# Patient Record
Sex: Male | Born: 1940 | Race: White | Hispanic: No | Marital: Married
Health system: Midwestern US, Community
[De-identification: ages and names within clinical notes are randomized; demographics above are authoritative.]

## PROBLEM LIST (undated history)

## (undated) DIAGNOSIS — M65352 Trigger finger, left little finger: Secondary | ICD-10-CM

---

## 2014-10-23 ENCOUNTER — Inpatient Hospital Stay: Admit: 2014-10-23 | Payer: MEDICARE | Attending: Cardiovascular Disease

## 2014-10-23 DIAGNOSIS — I208 Other forms of angina pectoris: Secondary | ICD-10-CM

## 2014-10-23 MED ORDER — NORMAL SALINE FLUSH 0.9 % IV SOLN
0.9 % | Freq: Two times a day (BID) | INTRAVENOUS | Status: DC
Start: 2014-10-23 — End: 2014-10-24

## 2014-10-23 MED ORDER — TECHNETIUM TC 99M SESTAMIBI - CARDIOLITE
Freq: Once | Status: AC | PRN
Start: 2014-10-23 — End: 2014-10-23
  Administered 2014-10-23: 13:00:00 9.3 via INTRAVENOUS

## 2014-10-23 MED ORDER — NORMAL SALINE FLUSH 0.9 % IV SOLN
0.9 % | INTRAVENOUS | Status: DC | PRN
Start: 2014-10-23 — End: 2014-10-24
  Administered 2014-10-23 (×2): 10 mL via INTRAVENOUS

## 2014-10-23 MED ORDER — TECHNETIUM TC 99M SESTAMIBI - CARDIOLITE
Freq: Once | Status: AC | PRN
Start: 2014-10-23 — End: 2014-10-23
  Administered 2014-10-23: 14:00:00 31 via INTRAVENOUS

## 2016-02-14 ENCOUNTER — Ambulatory Visit: Admit: 2016-02-14 | Discharge: 2016-02-14 | Payer: MEDICARE | Attending: Orthopaedic Surgery

## 2016-02-14 DIAGNOSIS — M65312 Trigger thumb, left thumb: Secondary | ICD-10-CM

## 2016-02-14 NOTE — Progress Notes (Signed)
Chief Complaint:   Chief Complaint   Patient presents with   ??? Finger Pain     Dr. Tennis MustAlbani referral left thumb pain and clicking       HPI 75 year old man with several months of pain and locking in the left thumb. He also has some increase in some chronic locking symptoms in the left middle finger.  He does have a history of prior right middle trigger finger that ultimately require surgical release and did well. He had injection to the left middle finger in the past with temporary improvement.    There is no problem list on file for this patient.      History reviewed. No pertinent past medical history.    Past Surgical History:   Procedure Laterality Date   ??? BACK SURGERY  1965   ??? FINGER TRIGGER RELEASE Right 03/04/2010    Right middle trigger finger  R.Loman ChromanBoniface, MD   ??? KNEE ARTHROSCOPY Left 02/18/2012    Left knee arthroscopy R. Loman ChromanBoniface, MD   ??? TONSILLECTOMY         No current outpatient prescriptions on file.     No current facility-administered medications for this visit.        No Known Allergies    Social History     Social History   ??? Marital status: Married     Spouse name: N/A   ??? Number of children: N/A   ??? Years of education: N/A     Social History Main Topics   ??? Smoking status: Former Smoker     Quit date: 1994   ??? Smokeless tobacco: Never Used   ??? Alcohol use 0.6 oz/week     1 Cans of beer per week   ??? Drug use: No   ??? Sexual activity: Not Asked     Other Topics Concern   ??? None     Social History Narrative   ??? None       Family History   Problem Relation Age of Onset   ??? Heart Disease Brother 8268     pacemaker         Review of Systems   No fever, chills, or other constitutional symptoms.  No numbness or other neuro symptoms.    Ortho Exam left hand exam demonstrates palpable locking and triggering of the IP joint of the right thumb with palpable triggering at the A1 pulley level.  Similar but less pronounced findings are present in the left middle ray.    Physical Exam    Patient is alert and  oriented.  Well-developed well-nourished. BMI 29.7  Pupils equal and reactive. Sclerae anicteric.  Neck supple  Lungs clear.  Cardiac rate and rhythm regular.  Abdomen soft and nontender.  Skin warm and dry.                    ASSESSMENT/PLAN:    Antonio Aguilar was seen today for finger pain.    Diagnoses and all orders for this visit:    Trigger thumb of left hand    Trigger finger, left middle finger     Treatment options reviewed.  Based on his prior experience he does not wish to have a steroid injections. He requests surgical release of the left thumb and middle finger.  The procedure, indications, risks, limitations and recovery time were all reviewed with patient, who requests to proceed.  We will tentatively schedule as outpatient for March 05, 2016.    Return in about  4 weeks (around 03/12/2016) for wound check Postoperative trigger fingers.       Concha Norway, MD    02/14/2016  11:53 AM

## 2016-02-14 NOTE — Progress Notes (Signed)
Surgical Procedure: LEFT THUMB / MIDDLE FINGER TENOSYNOVECTOMY, local, Date: Wednesday, March 05, 2016, morning, Place: [facility], Doctor:Raymond J Boniface. Pre Op Instructions given to patient. Patient takes no medications.

## 2016-02-15 NOTE — Telephone Encounter (Signed)
[facility] [employer] [facility]  No pre-cert required for Left thumb/middle finger tenosynovectomy ([id number]) 03/05/2016.  Call Ref# Mery I  02/15/2016

## 2016-03-13 ENCOUNTER — Ambulatory Visit: Admit: 2016-03-13 | Discharge: 2016-03-13 | Payer: MEDICARE | Attending: Orthopaedic Surgery

## 2016-03-13 DIAGNOSIS — M65312 Trigger thumb, left thumb: Secondary | ICD-10-CM

## 2016-03-13 NOTE — Progress Notes (Signed)
Sutures removed from op sites thumb and middle finger left hand.

## 2016-03-13 NOTE — Progress Notes (Signed)
Vincente Libertyennis M Radin  presents for follow-up status post left thumb and middle finger trigger finger releases, doing well reports complete relief of his preoperative pain and locking although he does still have the expected degree of local tenderness and pain with grip activities. No numbness tingling in the fingers no other joint complaints.    Allergies; medications; past medical, surgical, family, and social history; and problem list have been reviewed today and updated as indicated in this encounter.    Exam: Incisional sites of the left thumb and middle finger are benign, thumb is well approximated without erythema mild tenderness no locking, middle finger does show slight superficial opening but dry no erythema nor drainage.    Radiographs: Not applicable    Assessment and Plan: Doing well, sutures were removed today, patient was advised on some simple home exercise with gradual resumption of activities tolerance, follow-up in a month or so if having any residual problems or otherwise as needed.

## 2016-10-02 ENCOUNTER — Ambulatory Visit: Admit: 2016-10-02 | Discharge: 2016-10-02 | Payer: MEDICARE | Attending: Orthopaedic Surgery

## 2016-10-02 ENCOUNTER — Ambulatory Visit: Admit: 2016-10-02 | Discharge: 2016-10-06 | Payer: MEDICARE

## 2016-10-02 DIAGNOSIS — R200 Anesthesia of skin: Secondary | ICD-10-CM

## 2016-10-02 DIAGNOSIS — M25511 Pain in right shoulder: Secondary | ICD-10-CM

## 2016-10-02 NOTE — Progress Notes (Signed)
Chief Complaint:   Chief Complaint   Patient presents with   . Hand Problem     Bilateral hand numbness and tingling x 2 months  Dr. Tennis Must had him try braces with no relief   . Shoulder Pain     Rt shoulder pain x 4 weeks.  Difficulty raising right arm.  May have injured after lifting heavy box.   No X-rays       HPI 76 year old man complaining of bilateral hand numbness 2 months. Numbness affects all fingers. Notices it at night whenever he sleeps on the affected side and it takes some time in the morning to resolve. He does not have much trouble using the hands with activity.  He was given braces by primary care but has not found them helpful.    Also has had about 3 weeks of right shoulder pain after doing some lifting. The patient denies history of known cervical disease.    Patient Active Problem List   Diagnosis   . Acute pain of right shoulder       History reviewed. No pertinent past medical history.    Past Surgical History:   Procedure Laterality Date   . BACK SURGERY  1965   . FINGER TRIGGER RELEASE Right 03/04/2010    Right middle trigger finger  R.Alazne Quant, MD   . FINGER TRIGGER RELEASE Left 03/05/2016    Left trigger thumb release R. Jawuan Robb, MD   . KNEE ARTHROSCOPY Left 02/18/2012    Left knee arthroscopy R. Loman Chroman, MD   . TONSILLECTOMY         No current outpatient prescriptions on file.     No current facility-administered medications for this visit.        No Known Allergies    Social History     Social History   . Marital status: Married     Spouse name: N/A   . Number of children: N/A   . Years of education: N/A     Social History Main Topics   . Smoking status: Former Smoker     Quit date: 1994   . Smokeless tobacco: Never Used   . Alcohol use 0.6 oz/week     1 Cans of beer per week   . Drug use: No   . Sexual activity: Not Asked     Other Topics Concern   . None     Social History Narrative   . None       Family History   Problem Relation Age of Onset   . Heart Disease Brother 70      pacemaker         Review of Systems   No fever, chills, or other constitutional symptoms.  No numbness or other neuro symptoms.    Ortho Exam right shoulder exam demonstrates some discomfort with flexion abduction and internal rotation. He has forward flexion 120 abduction 110 internal rotation to the sacroiliac level. Abduction strength 4+/5. No effusion or crepitation. New. Bilateral hand exam demonstrates perhaps slight mild thenar atrophy. The more symptomatic left hand does have some tingling with Phalen testing, Tinel is negative.    Physical Exam    Patient is alert and oriented.  Well-developed well-nourished.BMI 29  Pupils equal and reactive. Sclerae anicteric.  Neck supple  Lungs clear.  Cardiac rate and rhythm regular.  Abdomen soft and nontender.  Skin warm and dry.       XRAY: AP and lateral x-ray view right shoulder taken today.  There is some degenerative change the glenohumeral joint with inferior osteophyte but reasonable joint space preservation. Slight cystic change and greater tuberosity. Subacromial space is intact. 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                    ASSESSMENT/PLAN:    Antonio Aguilar was seen today for hand problem and shoulder pain.    Diagnoses and all orders for this visit:    Numbness and tingling in both hands  -     EMG; Future    Acute pain of right shoulder  -     XR SHOULDER RIGHT (MIN 2 VIEWS); Future  -     Ambulatory referral to Physical Therapy     we'll treat right shoulder as strain with underlying osteoarthritis. He will see physical therapy to instruct in appropriate exercises.    Patient is referred for EMG testing bilateral upper extremities to evaluate for possible carpal tunnel syndrome, and to screen for cervical radiculopathy.    Return in about 4 weeks (around 10/30/2016).     If right shoulder not improved consider steroid injection.    Consider carpal tunnel release if  EMGs confirmed carpal tunnel syndrome.      Concha Norwayaymond J Imya Mance, MD    10/02/2016  9:16 AM

## 2016-11-10 ENCOUNTER — Ambulatory Visit: Admit: 2016-11-10 | Discharge: 2016-11-10 | Payer: MEDICARE | Attending: Physical Medicine & Rehabilitation

## 2016-11-10 DIAGNOSIS — G5603 Carpal tunnel syndrome, bilateral upper limbs: Secondary | ICD-10-CM

## 2016-11-10 NOTE — Patient Instructions (Signed)
Electrodiagnotic Laboratory  Accredited by the [employer] with Exemplary status  Janeen Masternick-Black, D.O  Stephanie Kopey, D.O.   [facility]  [address] [place] [address]  Phone: [phone]  Fax: [phone]        Today you had an electrodiagnostic exam which included nerve conduction studies (NCS) and electromyography (EMG). This test evaluated the electrical activity of your nerves and muscles to help determine if you have a nerve or muscle disease.  This test can help determine the location and type of a nerve or muscle problem. This will help your referring doctor diagnose your condition and determine the appropriate next step in your treatment plan.     After your test:    1. There are no long lasting side effects of the test.     2. You may resume your normal activities without restrictions.     3.  Resume any medications that were stopped for the test.     4  If you have sore areas or bruising in your muscles where the needle was placed, apply a cold pack to the sore area for 15-20 minutes three to four times a day as needed for pain.  The soreness should go away in about 1-2 days.     5. Your results were provided  Briefly at the end of your test and the final detailed report will be provided to your referring physician, and/or primary care physician and any other parties you requested within 1-2 days of the examination. You may wish to contact your referring provider after a few days to determine what they would like you to do next.     6.  Please call [phone] with any questions or concerns and if you develop increased body temperature/fever, swelling, tenderness, increased pain and/or drainage from the sites where the needle was placed.     Thank you for choosing us for your health care needs.

## 2016-11-10 NOTE — Progress Notes (Signed)
[facility]  Electrodiagnostic Laboratory  *Accredited by the [employer] with exemplary status  [address]  [place], [place] [id number]  Phone: [phone]  Fax: [phone]      Date of Examination: 11/10/16  Patient Name: Antonio Aguilar    Chief Complaint   Patient presents with   . Hand Pain     L>R   . Shoulder Pain   . Hand Numbness     L>R       Antonio Aguilar  is a 76 y.o. year old male who was seen due to complaints of numbness/tingling in bilateral hands that has been present for 3-4 months, worse at night. Also with pain in bilateral shoulders that has been present for 3-4 weeks and started after lifting boxes.     Physical Exam: MSK: There is no joint effusion, deformity, instability, swelling, erythema or warmth.  AROM is full in the spine. Decreased ROM bilateral shoulders with pain. TTP bilateral AC joints, subacromial space. Neurologic:  No focal sensorimotor deficit.  Reflexes 2+ and symmetric. Gait is normal.    Impression:     1. Bilateral carpal tunnel syndrome    2. Numbness and tingling in both hands        Plan:    EMG is indicated to evaluate the above diagnosis.    Orders Placed This Encounter   Procedures   . PR NEEDLE EMG EA EXTREMTY W/PARASPINL AREA COMPLETE   . PR MOTOR &/SENS 7-8 NRV CNDJ PRECONF ELTRODE LIMB      EMG was done today and showed bilateral carpal tunnel syndrome, moderate. The patient was educated about the diagnosis and the prognosis.    Recommend cock up wrist splints qhs, consider OT, injection vs surgical release.    Advised patient to follow up with referring provider.       Thank you for allowing me to participate in the care of your patient.      Sincerely,     Elwin MochaJaneen Masternick-Black, DO, [facility]   Board Certified Physical Medicine and Rehabilitation

## 2016-11-10 NOTE — Progress Notes (Signed)
[facility]  Electrodiagnostic Laboratory  *Accredited by the [employer] with exemplary status  [address]  [place], [place] [address]  Phone: [phone]  Fax: [phone]      Referring Provider: Concha Norway, MD  Primary Care Physician: Mickel Crow  Patient Name: Antonio Aguilar  Patient birth date: 1941/04/19  Gender: male  BMI: Body mass index is 28.48 kg/m??.  Height 6' (1.829 m), weight 210 lb (95.3 kg).    11/10/2016    Diagnostic Interpretation:     This study was abnormal.     1. 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 It is chronic in duration. This is consistent with the clinical diagnosis of carpal tunnel syndrome. Prognosis is good if cause is eliminated.     2. There is no electrodiagnostic evidence for C5-T1 motor radiculopathy in bilateral upper extremities. Sensory radiculopathy cannot be studied by EMG.           Description of clinical problem:   Chief Complaint   Patient presents with   ??? Hand Pain     L>R   ??? Shoulder Pain   ??? Hand Numbness     L>R     Pain Yes  Pain Score:   4; Numbness/tingling  Yes; Weakness  No       Special considerations:   Pacemaker/Defibrillator No; Anticoagulation or Antiplatelet No; Mestinon No; botulinum Toxin  No       Pertinent history:  Diabetes  No; Thyroid disease No; Alcohol abuse No; Family history of neuromuscular disease No; Pertinent surgical history Yes; left hand trigger finger    Allergies: Adhesive: No; Isopropyl alcohol: No       Brief physical exam:   Sensory deficit No; Weakness No; Atrophy  No; Reflex abnormality No    Consent: The patient was advised of the indications, risks, benefits and alternatives to nerve conduction studies and electromyography and agreed to proceed.     Sensory NCS      Nerve / Sites Rec. Site  Peak Lat PP Amp Segments Distance Velocity Temp.     ms ??V  cm m/s ??C   L Median - Digit II (Antidromic)      Palm Dig II 2.03 15.7 Palm - Dig II 7 46 32.3      Wrist Dig II 4.43 10.1 Wrist - Dig II 14 38 32.3   R Median - Digit II (Antidromic)      Palm Dig II 1.98 18.9 Palm - Dig II 7 48 33.2      Wrist Dig II 4.06 14.4 Wrist - Dig II 14 44 33.2   L Ulnar - Digit V (Antidromic)      Wrist Dig V 3.13 24.1 Wrist - Dig V 14 58 32.3   L Radial - Anatomical snuff box (Forearm)      Forearm Wrist 2.29 26.0 Forearm - Wrist 10 58 33.1       Motor NCS      Nerve / Sites Muscle Onset Amplitude Segments Distance Velocity Temp.     ms mV  cm m/s ??C   L Median - APB      Palm APB 2.24 9.8 Palm - APB   33.3      Wrist APB 5.78 6.1 Wrist - Palm 8 23 33.3  Elbow APB 9.95 5.4 Elbow - Wrist 21.5 52 33.3   R Median - APB      Palm APB 2.19 9.7 Palm - APB   33.3      Wrist APB 4.22 6.3 Wrist - Palm 8 39 33.2      Elbow APB 8.75 5.8 Elbow - Wrist 23.5 52 33.2   L Ulnar - ADM      Wrist ADM 2.66 10.2 Wrist - ADM 8  33.2      B.Elbow ADM 6.88 9.6 B.Elbow - Wrist 23.5 56 33.2      A.Elbow ADM 8.75 8.7 A.Elbow - B.Elbow 10 53 33.2       F Wave      Nerve Fmin % F    ms %   L Median - APB 30.05 80   L Ulnar - ADM 28.54 70   R Median - APB 31.55 60       EMG      EMG Summary Table     Spontaneous MUAP Recruitment   Muscle Nerve Roots IA Fib PSW Fasc Amp Dur. PPP Pattern   L. Deltoid Axillary C5-C6 N None None None N N N N   L. Biceps brachii Musculocutaneous C5-C6 N None None None N N N N   L. Triceps brachii Radial C6-C8 N None None None N N N N   L. Pronator teres Median C6-C7 N None None None N N N N   L. First dorsal interosseous Ulnar C8-T1 N None None None N N N N   L. Abductor pollicis brevis Median C8-T1 N None None None N N N N   R. Deltoid Axillary C5-C6 N None None None N N N N   R. Biceps brachii Musculocutaneous C5-C6 N None None None N N N N   R. Triceps brachii Radial C6-C8 N None None None N N N N   R. Pronator teres  Median C6-C7 N None None None N N N N   R. First dorsal interosseous Ulnar C8-T1 N None None None N N N N   R. Abductor pollicis brevis Median C8-T1 N None None None N N N N          Pain was assessed/reassessed during EMG and managed with appropriate intervention throughout the entire procedure. The patient was instructed in post procedure care.     Technical Considerations:  Obesity  No, Poor tolerance to test No, Skin callous No, Skin breakdown No, Edema No    Summary of Findings:   1. Sensory nerve conduction studies of bilateral median nerves showed prolonged peak wrist latencies, normal amplitudes and slow conduction velocity across the wrist. The left radial and ulnar nerves showed normal peak latencies, normal SNAP amplitudes, and normal conduction velocities.     2. Motor nerve conduction studies of the left median nerve showed prolonged distal wrist latencies, normal amplitudes and slow conduction velocity across the wrist. The right median nerve showed normal distal latencies, normal amplitudes and slow conduction velocity across the wrist. The left ulnar nerve showed normal distal latencies, normal CMAP amplitudes, and normal conduction velocities.     3. F-wave studies of bilateral median and left ulnar nerves revealed normal latencies.     4.  EMG was performed with concentric needle in multiple muscles outlined above. All muscles tested revealed normal insertional activity, without evidence of abnormal spontaneous activity. All MUAP's were of normal amplitude and duration with a full recruitment pattern. No increase  in polyphasic potentials was noted.        Diagnostic Interpretation:     This study was abnormal.     1. 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 It is chronic in duration. This is consistent with the  clinical diagnosis of carpal tunnel syndrome. Prognosis is good if cause is eliminated.     2. There is no electrodiagnostic evidence for C5-T1 motor radiculopathy in bilateral upper extremities. Sensory radiculopathy cannot be studied by EMG.         Comparison to prior EMG: There is not a prior EMG for comparison.      Follow up EMG is recommended in 6-12 months if symptoms persist despite treatment.     Thank you for the consultation and for allowing me to participate in the care of your patient.      Technologist: Charisse Klinefelter, LPN, CNCT  Physician:Purity Irmen Masternick-Black, DO, FAAPMR   Board Certified Physical Medicine and Rehabilitation      Normal nerve Conduction Values    For sensory nerve conduction studies, the amplitude is measured peak-to-peak, the latency reported is the distal peak latency, and the conduction velocity, if measured, is determined from the onset latencies and is over the forearm or leg respectively.      For motor nerve conduction studies, the amplitude is measured baseline-to-peak, the latency reported is the distal onset latency, the conduction velocity is calculated over the forearm or leg respectively. F waves are reported as minimal latency and are performed as follows: recording the abductor pollicis brevis stimulating the median nerve at the wrist, recording the abductor digiti minimi stimulating the ulnar nerve at the wrist, recording at the extensor digitorum brevis stimulating the peroneal nerve at the ankle and recording the abductor hallucis stimulating tibial at the medial malleolus.     For H-Reflexes the site of stimulation is in the popliteal fossa and the recording site is the gastrocnemius.  For F waves, the site of stimulation is at the wrist or ankle respectively and the muscle recorded is the same as that used for the compound motor unit action potentials as described in the table.          Sensory Nerves Peak to Peak  Amplitude  (mV) Peak Latency (ms)    Superficial Radial Sensory Antidromic (10cm) 11 2.8    Median Sensory Antidromic Dig II   Palm (7cm)  Wrist (14 cm)  Age 44-49 BMI <24  Age 17-79 BMI <24  Age 41-49 BMI >/= 24  Age 74-79 BMI >/= 24   8  13  19  15  13  8    2.3  4     Ulnar Sensory Antidromic Dig V (14 cm)  Age 58-49 BMI <24  Age 45-79 BMI <24  Age 80-49 BMI >/= 24  Age 65-79 BMI >/= 24 9  13  13  8  4 4    Medial Antebrachial cutaneous Sensory Antidromic (10 cm)   3 2.6   Lateral Antebrachial cutaneous Sensory Antidromic (10 cm) 6 2.5   Sural Sensory Antidromic (14 cm) 4 4.5     Medial and lateral Plantars (14 cm)   Compare side to side <3.8     Superficial peroneal sensory (10 cm)  Age <9  Age 44-29  Age 49-49  Age 84-59  Age >35   >6  >6  >  5  >4  >3   <4.3  <4.4  <4.5  <4.6  <4.6     Saphenous sensory (12 cm)  Age <9  Age 29-29  Age 33-49  Age 37-59  Age >14   >6  >6  >4  >4  >3   <4.3  <4.4  <4.5  <4.6  <4.6     Dorsal ulnar sensory (8 cm)  Age <9  Age 63-29  Age 36-49  Age 76-59  Age >20   >93  >18  >80  >10  >5   <2.9  <3  <3.1  <3.1  <3.2         Study Latency difference (ms)   Median compared to (minus) ulnar motor comparison  All ages  Age 41-49  Age 67-79   1.5  1.4  1.7   Median to Ulnar comparison (second lumbrical/interossei)  0.4     Combined Sensory Index:   Study Latency Difference (ms)</=   Median to Ulnar Palmar Orthodromic mixed nerve comparison 0.3   Median to Ulnar sensory Ring finger comparison 0.4       Median: Radial sensory digit 1 comparison 0.5     Combined Sensory Index (CSI)  0.9       Motor Nerves Baseline to Peak Amplitude  (mV) Conduction Velocity (m/s) Distal Latency (ms)   Median motor APB  All ages  Men Age13 to 31  Men Age 49 to 30  Men Age 31 to 62  Men Age 22 to 75  Men age 36 to 62  Women Age 35 to 5  Women Age 8 to 52  Women Age 66 to 30  Women Age 89 to 84  Women Age 21 to 67   4.1  5.9  4.2   4.2   3.8  3.8  5.9  4.2  4.2  3.8  3.8   49  49  47  47  47  47  53  51  51  51  51    4.5  4.6  4.6  4.7  4.7  4.7  4.4  4.4  4.4  4.4  4.4   Ulnar motor ADM  All ages  Below elbow  Across elbow  Above elbow  CV drop across elbow  % CV drop across elbow   7.9     52  43  50  15 m/s  23%   3.7   Fibular (peroneal) motor EDB  All ages  Age 91 to 69 <170 cm tall   Age 8 to 52 >170 cm tall  Age 96 to 10 <170 cm tall  Age 71 to 18 >170 cm tall  CV across fibular head  CV drop across fibular head  % CV drop across fibular head  % amplitude drop ankle to below fibula  % amplitude drop across fibular head   1.3  2.6  2.6  1.1  1.1        32%  25%   38  43  37  39  36  42  32m/s  12%     6.5  6.5  6.5  6.5  6.5   Tibial motor AH  All ages  Age 43 to 30 <160 cm tall  Age 60-49 <160 cm tall  Age 53 to 40 <160 cm tall  Age 24 to 41 <160 cm tall  Age 25 to 7, 160-170 cm tall  Age 60-49 160-170 cm tall  Age 49 to  59 160-170 cm tall  Age 75 to 79 160-170 cm tall  Age 74 to 6929 >/=170 cm tall  Age 78-49 >/=170 cm tall  Age 74 to 4259 >/=170 cm tall  Age 74 to 6879 >/=170 cm tall  Amplitude drop from ankle to knee  % amplitude drop ankle to knee   4.4  5.8  5.3  5.3  1.1  5.8  5.3  5.3  1.1  5.8  5.3  5.3  1.1  10.3  71%   39  44  44  40  40  42  42  34  34  37  37  34  34   6.1  6.1  6.1  6.1  6.1  6.1  6.1  6.1  6.1  6.1  6.1  6.1  6.1     Ulnar motor (FDI)  Age <9  Age 74-29  Age 74-49  Age 74-59  Age 7>60   >8  >8  >7  >7  >7   >51  >51  >50  >50  >50   <3.8  <3.8  <4.3  <4.5  <4.5     Radial motor (EDC)  Age <9  Age 74-29  Age 74-49  Age 74-59  Age 25>60   >6  >6  >6  >5  >5   78>51  36>51  >50  >50  >50   <3.0  <3.0  <3.1  <3.1  <3.1   Musculocutaneous motor (Biceps)   Age <9  Age 74-29  Age 74-49  Age 74-59  Age 26>60   >4  >4  >4  >4  >3    <3.5  <3.5  <3.5  <3.5  <3.8   Axillary motor (Deltoid)  Age <9  Age 74-29  Age 74-49  Age 74-59  Age 74>60   >4  >4  >4  >4  >3    <4.8  <4.8  <4.8  <4.8  <5     Tibial motor (ADQP)  Age <9  Age 74-29  Age 74-49  Age 950-59  Age 39>60   >4  >4  >4  >3  >3   >41  >41  >41  >40  >40    <6.0  <6.0  <6.5  <6.5  <6.5   Peroneal motor (TA)  Age <9  Age 74-29>4  Age 74-49  Age 74-59  Age 49>60   >4  >4  >4  >3  >3   >41  >41  >40  >40  >40   <4  <4  <4  <4.5  <4.5     Femoral motor (RF)  Age <9  Age 74-29  Age 830-49  Age 150-59  Age 47>60   >4  >4  >4  >3  >3      <6.0  <6.0  <6.5  <6.5  <6.5         Nerve F  Minimal latency (ms)   Median (APB) 32   Ulnar (ADM)  32   Peroneal motor (EDB) <56   Tibial motor (AH) <56     Nerve  H-reflex latency (ms) H reflex- side to side latency  difference (ms)   Tibial  (gatroc-soleus) <34  <1.5       Needle EMG:  Fibrillations, positive sharp waves and fasciculations are graded from none (0) to continuous (4+).  The configuration and recruitment pattern of motor unit action potentials under voluntary control are described below.  Cc: Concha Norway, MD  Mickel Crow

## 2016-11-17 ENCOUNTER — Encounter: Payer: MEDICARE | Attending: Orthopaedic Surgery

## 2016-11-28 ENCOUNTER — Encounter: Attending: Physical Medicine & Rehabilitation

## 2018-10-11 ENCOUNTER — Ambulatory Visit: Admit: 2018-10-11 | Discharge: 2018-10-11 | Payer: MEDICARE | Attending: Orthopaedic Surgery

## 2018-10-11 DIAGNOSIS — M65352 Trigger finger, left little finger: Secondary | ICD-10-CM

## 2018-10-11 NOTE — Progress Notes (Signed)
Surgical Procedure: LEFT SMALL FINGER TRIGGER FINGER RELEASE / TENOSYNOVECTOMY, Anesthesia: Local, Date: Monday, October 18, 2018, Time: case #2, Place: [facility], Surgeon: Concha Norway. Medications reviewed with the patient / holding no  medications. Pre Op Instructions reviewed with the patient. Informed patient: A hospital nurse will contact him with instructions for the day of surgery. The patient expresses understanding and is in agreement with the plan.      Post Op Appointment: Monday, June 22 cd at 10:15 am

## 2018-10-11 NOTE — Progress Notes (Signed)
Chief Complaint:   Chief Complaint   Patient presents with   ??? Trigger finger     Trigger finger left little finger.         HPI   78 year old man with several weeks of pain and locking of his left small finger.  Had previous other trigger fingers that came to surgery with surgical release and good outcome.  No injury history.      Patient Active Problem List   Diagnosis   ??? Acute pain of right shoulder   ??? Trigger little finger of left hand       No past medical history on file.    Past Surgical History:   Procedure Laterality Date   ??? BACK SURGERY  1965   ??? FINGER TRIGGER RELEASE Right 03/04/2010    Right middle trigger finger  R.Liliani Bobo, MD   ??? FINGER TRIGGER RELEASE Left 03/05/2016    Left trigger thumb release R. Loman ChromanBoniface, MD   ??? KNEE ARTHROSCOPY Left 02/18/2012    Left knee arthroscopy R. Loman ChromanBoniface, MD   ??? TONSILLECTOMY         Current Outpatient Medications   Medication Sig Dispense Refill   ??? gabapentin (NEURONTIN) 300 MG capsule TK 1 C PO TID FOR UP TO 20 DAYS PRN  11   ??? Glucosamine Sulfate 500 MG TABS Take 500 mg by mouth       No current facility-administered medications for this visit.        No Known Allergies    Social History     Socioeconomic History   ??? Marital status: Married     Spouse name: Not on file   ??? Number of children: Not on file   ??? Years of education: Not on file   ??? Highest education level: Not on file   Occupational History   ??? Not on file   Social Needs   ??? Financial resource strain: Not on file   ??? Food insecurity     Worry: Not on file     Inability: Not on file   ??? Transportation needs     Medical: Not on file     Non-medical: Not on file   Tobacco Use   ??? Smoking status: Former Smoker     Last attempt to quit: 1994     Years since quitting: 26.4   ??? Smokeless tobacco: Never Used   Substance and Sexual Activity   ??? Alcohol use: Yes     Alcohol/week: 1.0 standard drinks     Types: 1 Cans of beer per week   ??? Drug use: No   ??? Sexual activity: Not on file   Lifestyle   ??? Physical  activity     Days per week: Not on file     Minutes per session: Not on file   ??? Stress: Not on file   Relationships   ??? Social Wellsite geologistconnections     Talks on phone: Not on file     Gets together: Not on file     Attends religious service: Not on file     Active member of club or organization: Not on file     Attends meetings of clubs or organizations: Not on file     Relationship status: Not on file   ??? Intimate partner violence     Fear of current or ex partner: Not on file     Emotionally abused: Not on file     Physically abused: Not  on file     Forced sexual activity: Not on file   Other Topics Concern   ??? Not on file   Social History Narrative   ??? Not on file       Family History   Problem Relation Age of Onset   ??? Heart Disease Brother 10        pacemaker         Review of Systems   No fever, chills, or other constitutionalsymptoms.  No numbness or other neuro symptoms.    @PHYSEXAMORTHO @   Left hand exam demonstrates no swelling or deformity other than some diffuse degenerative changes.  Full range of motion left small finger lacking 5 degrees of extension.  Palpable tenderness and locking at the A1 pulley level with locking of the PIP joint.    Physical Exam    Patient is alert and oriented.  Well-developed well-nourished.  BMI 30  Pupils equal and reactive. Scleraeanicteric.  Neck supple  Lungs clear.  Cardiac rate and rhythm regular.  Abdomen soft and nontender.  Skin warm and dry.                      ASSESSMENT/PLAN:    Lenard was seen today for trigger finger.    Diagnoses and all orders for this visit:    Trigger little finger of left hand    Treatment options reviewed.  He declines injection.  He wants to schedule surgical release as soon as possible under local anesthetic.  The procedure, indications, risks, limitations and recovery time were all reviewed with patient, who requests to proceed.    The patient was counseled at length about the risks of contracting Covid-19 during their perioperative period  and any recovery window from their procedure.  The patient was made aware that contracting Covid-19  may worsen their prognosis for recovering from their procedure  and lend to a higher morbidity and/or mortality risk.  All material risks, benefits, and reasonable alternatives including postponing the procedure were discussed. The patient does wish to proceed with the procedure at this time.          Return for TBA postop left small trigger finger release.       Margarette Asal, MD    10/11/2018  1:22 PM

## 2018-10-13 ENCOUNTER — Inpatient Hospital Stay: Payer: MEDICARE

## 2018-10-13 DIAGNOSIS — Z01818 Encounter for other preprocedural examination: Secondary | ICD-10-CM

## 2018-10-13 NOTE — H&P (Signed)
Margarette Asal, MD   Physician   Orthopedic Surgery                                       Chief Complaint   Patient presents with   ??? Trigger finger   ?? ?? Trigger finger left little finger.     ??  ??  HPI   78 year old man with several weeks of pain and locking of his left small finger.  Had previous other trigger fingers that came to surgery with surgical release and good outcome.  No injury history.    ??      Patient Active Problem List   Diagnosis   ??? Acute pain of right shoulder   ??? Trigger little finger of left hand   ??  ??  Past Medical History   No past medical history on file.     ??  Past Surgical History         Past Surgical History:   Procedure Laterality Date   ??? BACK SURGERY ?? 1965   ??? FINGER TRIGGER RELEASE Right 03/04/2010   ?? Right middle trigger finger  R.Christinamarie Tall, MD   ??? FINGER TRIGGER RELEASE Left 03/05/2016   ?? Left trigger thumb release R. Deanna Artis, MD   ??? KNEE ARTHROSCOPY Left 02/18/2012   ?? Left knee arthroscopy R. Vibhav Waddill, MD   ??? TONSILLECTOMY ?? ??      ??  ??  Current Facility-Administered Medications          Current Outpatient Medications   Medication Sig Dispense Refill   ??? gabapentin (NEURONTIN) 300 MG capsule TK 1 C PO TID FOR UP TO 20 DAYS PRN ?? 11   ??? Glucosamine Sulfate 500 MG TABS Take 500 mg by mouth ?? ??   ??  No current facility-administered medications for this visit.       ??  ??  No Known Allergies  ??  Social History   Social History   ??        Socioeconomic History   ??? Marital status: Married   ?? ?? Spouse name: Not on file   ??? Number of children: Not on file   ??? Years of education: Not on file   ??? Highest education level: Not on file   Occupational History   ??? Not on file   Social Needs   ??? Financial resource strain: Not on file   ??? Food insecurity   ?? ?? Worry: Not on file   ?? ?? Inability: Not on file   ??? Transportation needs   ?? ?? Medical: Not on file   ?? ?? Non-medical: Not on file   Tobacco Use   ??? Smoking status: Former Smoker   ?? ?? Last attempt to quit: 1994   ?? ?? Years since  quitting: 26.4   ??? Smokeless tobacco: Never Used   Substance and Sexual Activity   ??? Alcohol use: Yes   ?? ?? Alcohol/week: 1.0 standard drinks   ?? ?? Types: 1 Cans of beer per week   ??? Drug use: No   ??? Sexual activity: Not on file   Lifestyle   ??? Physical activity   ?? ?? Days per week: Not on file   ?? ?? Minutes per session: Not on file   ??? Stress: Not on file   Relationships   ??? Social connections   ?? ??  Talks on phone: Not on file   ?? ?? Gets together: Not on file   ?? ?? Attends religious service: Not on file   ?? ?? Active member of club or organization: Not on file   ?? ?? Attends meetings of clubs or organizations: Not on file   ?? ?? Relationship status: Not on file   ??? Intimate partner violence   ?? ?? Fear of current or ex partner: Not on file   ?? ?? Emotionally abused: Not on file   ?? ?? Physically abused: Not on file   ?? ?? Forced sexual activity: Not on file   Other Topics Concern   ??? Not on file   Social History Narrative   ??? Not on file      ??  ??  Family History         Family History   Problem Relation Age of Onset   ??? Heart Disease Brother 68   ??     pacemaker          ??  Review of Systems   No fever, chills, or other constitutionalsymptoms.  No numbness or other neuro symptoms.  ??  @PHYSEXAMORTHO @   Left hand exam demonstrates no swelling or deformity other than some diffuse degenerative changes.  Full range of motion left small finger lacking 5 degrees of extension.  Palpable tenderness and locking at the A1 pulley level with locking of the PIP joint.  ??  Physical Exam    Patient is alert and oriented.  Well-developed well-nourished.  BMI 30  Pupils equal and reactive. Scleraeanicteric.  Neck supple  Lungs clear.  Cardiac rate and rhythm regular.  Abdomen soft and nontender.  Skin warm and dry.                    ??  ASSESSMENT/PLAN:  ??  [name] was seen today for trigger finger.  ??  Diagnoses and all orders for this visit:  ??  Trigger little finger of left hand  ??  Treatment options reviewed.  He declines injection.   He wants to schedule surgical release as soon as possible under local anesthetic.  The procedure, indications, risks, limitations and recovery time were all reviewed with patient, who requests to proceed.  ??  The patient was counseled at length about the risks of contracting Covid-19 during their perioperative period and any recovery window from their procedure.?? The patient was made aware that contracting Covid-19  may worsen their prognosis for recovering from their procedure  and lend to a higher morbidity and/or mortality risk.?? All material risks, benefits, and reasonable alternatives including postponing the procedure were discussed. The patient??does wish to proceed with the procedure at this time.  ??     ??  ??  Concha Norwayaymond J Azalee Weimer, MD  ??    ??

## 2018-10-14 LAB — COVID-19 AMBULATORY: SARS-CoV-2: NOT DETECTED

## 2018-10-14 NOTE — Progress Notes (Signed)
Have you been tested for COVID  Yes      10/13/2018 preop test       Have you been told you were positive for COVID No  Have you had any known exposure to someone that is positive for COVID No  Do you have a cough                   No              Do you have shortness of breath No                 Do you have a sore throat            No                Are you having chills                    No                Are you having muscle aches.     No                    Please come to the hospital wearing a mask and have your significant other wear a mask as well.  Both of you should check your temperature before leaving to come here,  if it is 100 or higher please call [phone] for instruction.

## 2018-10-14 NOTE — Telephone Encounter (Signed)
Spoke with [facility] Crown @ [facility].  No pre-cert is required for Left small trigger finger tenosynovectomy (19511) 10/18/2018 [facility] OPT.  Call ref# [id number].

## 2018-10-14 NOTE — Progress Notes (Signed)
[facility] PRE-ADMISSION TESTING INSTRUCTIONS    The Preadmission Testing patient is instructed accordingly using the following criteria (check applicable):    ARRIVAL INSTRUCTIONS:  [x]  Parking the day of Surgery is located in the Main Entrance lot.  Upon entering the door, make an immediate right to the surgery reception desk    [x]  Bring photo ID and insurance card    [x]  Bring in a copy of Living will or Durable Power of attorney papers.    [x]  Please be sure to arrange for responsible adult to provide transportation to and from the hospital    [x]  Please arrange for responsible adult to be with you for the 24 hour period post procedure due to having anesthesia      GENERAL INSTRUCTIONS:    [x]  Nothing by mouth after midnight, including gum, candy, mints or water    [x]  You may brush your teeth, but do not swallow any water    []  Take medications as instructed with 1-2 oz of water    [x]  Stop herbal supplements and vitamins 5 days prior to procedure    [x]  Follow preop dosing of blood thinners per physician instructions    []  Take 1/2 dose of evening insulin, but no insulin after midnight    []  No oral diabetic medications after midnight    []  If diabetic and have low blood sugar or feel symptomatic, take 1-2oz apple juice only    []  Bring inhalers day of surgery    []  Bring C-PAP/ Bi-Pap day of surgery    []  Bring urine specimen day of surgery    [x]  Shower or bath with soap, lather and rinse well, AM of Surgery, no lotion, powders or creams to surgical site    []  Follow bowel prep as instructed per surgeon    [x]  No tobacco products within 24 hours of surgery     [x]  No alcohol or illegal drug use within 24 hours of surgery.    [x]  Jewelry, body piercing's, eyeglasses, contact lenses and dentures are not permitted into surgery (bring cases)      []  Please do not wear any nail polish, make up or hair products on the day of surgery    [x]  If not already done, you can expect a call from  registration    [x]  You can expect a call the business day prior to procedure to notify you if your arrival time changes    [x]  If you receive a survey after surgery we would greatly appreciate your comments    []  Parent/guardian of a minor must accompany their child and remain on the premises  the entire time they are under our care     []  Pediatric patients may bring favorite toy, blanket or comfort item with them    []  A caregiver or family member must remain with the patient during their stay if they are mentally handicapped, have dementia, disoriented or unable to use a call light or would be a safety concern if left unattended    [x]  Please notify surgeon if you develop any illness between now and time of surgery (cold, cough, sore throat, fever, nausea, vomiting) or any signs of infections  including skin, wounds, and dental.    [x]   The Outpatient Pharmacy is available to fill your prescription here on your day of surgery, ask your preop nurse for details    []  Other instructions  EDUCATIONAL MATERIALS PROVIDED:    []   PAT Preoperative Education Packet/Booklet     []  Medication List    []  Fluoroscopy Information Pamphlet    []  Transfusion bracelet applied with instructions    []  Joint replacement video reviewed    []  Shower with soap, lather and rinse well, and use CHG wipes provided the evening before surgery as instructed

## 2018-10-18 ENCOUNTER — Inpatient Hospital Stay: Payer: MEDICARE

## 2018-10-18 MED ORDER — LIDOCAINE HCL 1 % IJ SOLN
1 | INTRAMUSCULAR | Status: AC
Start: 2018-10-18 — End: 2018-10-18

## 2018-10-18 MED ORDER — LIDOCAINE HCL (PF) 1 % IJ SOLN
1 % | INTRAMUSCULAR | Status: DC | PRN
Start: 2018-10-18 — End: 2018-10-18
  Administered 2018-10-18: 18:00:00 5 via INTRADERMAL

## 2018-10-18 MED ORDER — POVIDONE-IODINE 10 % EX SOLN
10 | CUTANEOUS | Status: AC
Start: 2018-10-18 — End: 2018-10-18

## 2018-10-18 MED FILL — POVIDONE-IODINE 10 % EX SOLN: 10 % | CUTANEOUS | Qty: 118

## 2018-10-18 MED FILL — XYLOCAINE 1 % IJ SOLN: 1 % | INTRAMUSCULAR | Qty: 20

## 2018-10-18 NOTE — Discharge Instructions (Signed)
No heavy lifting, pulling, or pushing  Leave dressing clean, dry, and intact  Take pain medication as prescribed  Follow up with Dr. France Ravens, call to make appointment

## 2018-10-18 NOTE — Anesthesia Pre-Procedure Evaluation (Addendum)
Department of Anesthesiology  Preprocedure Note       Name:  Antonio Aguilar   Age:  78 y.o.  DOB:  05/05/1940                                          MRN:  [id number]         Date:  10/18/2018      Surgeon: Moishe SpiceSurgeon(s):  Concha Norwayaymond J Boniface, MD    Procedure: Procedure(s):  LEFT SMALL FINGER TRIGGER FINGER RELEASE / TENOSYNOVECTOMY    Medications prior to admission:   Prior to Admission medications    Not on File       Current medications:    No current facility-administered medications for this encounter.        Allergies:  No Known Allergies    Problem List:    Patient Active Problem List   Diagnosis Code   ??? Acute pain of right shoulder M25.511   ??? Trigger little finger of left hand M65.352       Past Medical History:        Diagnosis Date   ??? Trigger finger, left little finger 10/2018       Past Surgical History:        Procedure Laterality Date   ??? BACK SURGERY  1965   ??? FINGER TRIGGER RELEASE Right 03/04/2010    Right middle trigger finger  R.Boniface, MD   ??? FINGER TRIGGER RELEASE Left 03/05/2016    Left trigger thumb release R. Loman ChromanBoniface, MD   ??? KNEE ARTHROSCOPY Left 02/18/2012    Left knee arthroscopy R. Loman ChromanBoniface, MD   ??? TONSILLECTOMY         Social History:    Social History     Tobacco Use   ??? Smoking status: Former Smoker     Types: Cigarettes     Last attempt to quit: 1994     Years since quitting: 26.4   ??? Smokeless tobacco: Never Used   Substance Use Topics   ??? Alcohol use: Yes     Alcohol/week: 14.0 standard drinks     Types: 14 Cans of beer per week                                Counseling given: Not Answered      Vital Signs (Current):   Vitals:    10/14/18 1509 10/18/18 1157   BP:  (!) 154/81   Pulse:  85   Resp:  18   Temp:  97.2 ??F (36.2 ??C)   TempSrc:  Temporal   SpO2:  95%   Weight: 205 lb (93 kg) 205 lb (93 kg)   Height: 6' (1.829 m) 6' (1.829 m)                                              BP Readings from Last 3 Encounters:   10/18/18 (!) 154/81   10/02/16 (!) 140/80   03/13/16 (!) 154/92        NPO Status: Time of last liquid consumption: 2200  Time of last solid consumption: 2200                        Date of last liquid consumption: 10/17/18                        Date of last solid food consumption: 10/17/18    BMI:   Wt Readings from Last 3 Encounters:   10/18/18 205 lb (93 kg)   10/11/18 210 lb (95.3 kg)   11/10/16 210 lb (95.3 kg)     Body mass index is 27.8 kg/m??.    CBC: No results found for: WBC, RBC, HGB, HCT, MCV, RDW, PLT    CMP: No results found for: NA, K, CL, CO2, BUN, CREATININE, GFRAA, AGRATIO, LABGLOM, GLUCOSE, PROT, CALCIUM, BILITOT, ALKPHOS, AST, ALT    POC Tests: No results for input(s): POCGLU, POCNA, POCK, POCCL, POCBUN, POCHEMO, POCHCT in the last 72 hours.    Coags: No results found for: PROTIME, INR, APTT    HCG (If Applicable): No results found for: PREGTESTUR, PREGSERUM, HCG, HCGQUANT     ABGs: No results found for: PHART, PO2ART, PCO2ART, HCO3ART, BEART, O2SATART     Type & Screen (If Applicable):  No results found for: LABABO, LABRH    Drug/Infectious Status (If Applicable):  No results found for: HIV, HEPCAB    COVID-19 Screening (If Applicable):   Lab Results   Component Value Date    COVID19 Not Detected 10/13/2018         Anesthesia Evaluation  Patient summary reviewed no history of anesthetic complications:   Airway:         Dental:          Pulmonary:                             ROS comment: COVID Neg 10/13/2018   Cardiovascular:Negative CV ROS                      Neuro/Psych:   Negative Neuro/Psych ROS              GI/Hepatic/Renal: Neg GI/Hepatic/Renal ROS            Endo/Other:                      ROS comment: LEFT SMALL TRIGGER FINGER Abdominal:           Vascular: negative vascular ROS.                                     Anesthesia Plan      ASA 2                           Chart review only    Alver Fisher, MD   10/18/2018

## 2018-10-18 NOTE — Progress Notes (Signed)
DR. Marijean Bravo NOTIFIED OF ALL BP'S.

## 2018-10-18 NOTE — H&P (Signed)
Patient seen and examined preop.  No change in H&P.

## 2018-10-18 NOTE — Op Note (Signed)
Operative Note      Patient: Antonio Aguilar  Date of Birth: 21-Feb-1941  MRN: [id number]    Date of Procedure: 10/18/2018    Pre-Op Diagnosis: LEFT SMALL TRIGGER FINGER    Post-Op Diagnosis: Same       Procedure(s):  LEFT SMALL FINGER TRIGGER FINGER RELEASE / TENOSYNOVECTOMY    Surgeon(s):  Margarette Asal, MD    Assistant:   Resident: Renato Battles  Anesthesia: Local    Estimated Blood Loss (mL): Minimal    Complications: None      Operative course    The patient was seen and identified outside the operating room.  Operative extremity was marked.  Patient was taken back to the operative room.  A tourniquet was placed high on the upper arm of the left upper extremity.  Timeout was performed by the surgeon and all parties present in the room.  Operative site was agreed upon.:  Local anesthetic was applied to the region of the left fifth digit trigger finger after confirmation from palpation and active range of motion.    The patient's hand was prepped and draped in standard sterile fashion.  Again confirmation of the landmarks the trigger finger were reaffirmed as well as adequate  time had transpired for the local anesthetic to take effect which was tested via palpation response from the patient. Sharp dissection was carried through the skin with 15 blade.  Scissor dissection was performed in longitudinal fashion with the tendon down to the A1 pulley which was confirmed with visual and palpatory feedback.  All neurovascular structures were protected this was sharply incised with a 15 blade.   Tenosynovectomy was performed.  Remainder of the A1 pulley was released with scissor.      Smooth gliding of the tendon was confirmed with active range of motion from the patient without any appreciable catching or triggering of the fifth digit.  The wound was copiously irrigated, interrupted 4-0 nylon suture was used for closure.  Standard bulky sterile dressing was applied to the hand.    The patient was transferred from the  operative table ICA fashion to a cart.  He was then transferred out of the operative theater.  The patient requested over-the-counter pain medication, he will follow-up in office in 1 week.  It should be noted that Dr. Deanna Artis was present for the entirety of the procedure      Electronically signed by [name], DO on 10/18/2018 at 2:46 PM

## 2018-10-25 ENCOUNTER — Ambulatory Visit: Admit: 2018-10-25 | Discharge: 2018-10-25 | Payer: MEDICARE | Attending: Orthopaedic Surgery

## 2018-10-25 DIAGNOSIS — M65352 Trigger finger, left little finger: Secondary | ICD-10-CM

## 2018-10-25 NOTE — Progress Notes (Signed)
Chief Complaint:   Chief Complaint   Patient presents with   ??? Post-Op Check     Left small finger trigger finger release.  Surgery 10/18/2018.  C/o numbness and painful when bending finger.       HPI   1 week postop left small finger trigger finger release.  He has no catching but he has some swelling and pain at the surgical site and has some numbness in the tip of the small finger.    Patient Active Problem List   Diagnosis   ??? Acute pain of right shoulder   ??? Trigger finger, left little finger   ??? Preop testing       Past Medical History:   Diagnosis Date   ??? Trigger finger, left little finger 10/2018       Past Surgical History:   Procedure Laterality Date   ??? [place]   ??? FINGER TRIGGER RELEASE Right 03/04/2010    Right middle trigger finger  R.Marili Vader, MD   ??? FINGER TRIGGER RELEASE Left 03/05/2016    Left trigger thumb release R. Marylynne Keelin, MD   ??? FINGER TRIGGER RELEASE Left 10/18/2018    LEFT SMALL FINGER TRIGGER FINGER RELEASE / TENOSYNOVECTOMY performed by Margarette Asal, MD at [facility]   ??? KNEE ARTHROSCOPY Left 02/18/2012    Left knee arthroscopy R. Zhane Donlan, MD   ??? TONSILLECTOMY         No current outpatient medications on file.     No current facility-administered medications for this visit.        No Known Allergies    Social History     Socioeconomic History   ??? Marital status: Married     Spouse name: None   ??? Number of children: None   ??? Years of education: None   ??? Highest education level: None   Occupational History   ??? None   Social Needs   ??? Financial resource strain: None   ??? Food insecurity     Worry: None     Inability: None   ??? Transportation needs     Medical: None     Non-medical: None   Tobacco Use   ??? Smoking status: Former Smoker     Types: Cigarettes     Last attempt to quit: 1994     Years since quitting: 26.4   ??? Smokeless tobacco: Never Used   Substance and Sexual Activity   ??? Alcohol use: Yes     Alcohol/week: 14.0 standard drinks     Types: 14 Cans of beer per week   ???  Drug use: No   ??? Sexual activity: None   Lifestyle   ??? Physical activity     Days per week: None     Minutes per session: None   ??? Stress: None   Relationships   ??? Social Product manager on phone: None     Gets together: None     Attends religious service: None     Active member of club or organization: None     Attends meetings of clubs or organizations: None     Relationship status: None   ??? Intimate partner violence     Fear of current or ex partner: None     Emotionally abused: None     Physically abused: None     Forced sexual activity: None   Other Topics Concern   ??? None   Social History Narrative   ???  None       Family History   Problem Relation Age of Onset   ??? Heart Disease Brother 7068        pacemaker         Review of Systems   No fever, chills, or other constitutionalsymptoms.  No numbness or other neuro symptoms.    @PHYSEXAMORTHO @   On exam the wound is healing cleanly there is no erythema or drainage.  There is some edema tenderness and ecchymosis but again no erythema.  Sutures removed with minimal skin separation dressed with ointment and Band-Aid.  He has full range of motion of the finger without locking lacking 10 degrees of tightness in full extension.  Sensation testing demonstrates intact sharp sense of the ulnar aspect the radial aspect the tip of the thumb has present but diminished sensation to sharp.    Physical Exam    Patient is alert and oriented.  Well-developed well-nourished.  Pupils equal and reactive. Scleraeanicteric.  Neck supple  Lungs clear.  Cardiac rate and rhythm regular.  Abdomen soft and nontender.  Skin warm and dry.                      ASSESSMENT/PLAN:    [name] was seen today for post-op check.    Diagnoses and all orders for this visit:    Trigger little finger of left hand    Overall flex findings are satisfactory for 1 week.  Pain and range of motion have a number of weeks to improve.  He probably he has some partial numbness of the digital nerve radial aspect  which we will observe.  Range of motion exercises reviewed.    Return in about 4 weeks (around 11/22/2018).       Concha Norwayaymond J Elim Peale, MD    10/25/2018  10:17 AM

## 2018-10-26 NOTE — Progress Notes (Signed)
Sutures removed from left small finger (trigger finger release) incision without difficulty.  No drainage.  Dressed with ointment and Band-Aid. Patient tolerated procedure well.

## 2018-11-22 ENCOUNTER — Encounter: Attending: Orthopaedic Surgery

## 2018-11-24 ENCOUNTER — Ambulatory Visit: Admit: 2018-11-24 | Discharge: 2018-11-24 | Payer: MEDICARE | Attending: Orthopaedic Surgery

## 2018-11-24 DIAGNOSIS — M65352 Trigger finger, left little finger: Secondary | ICD-10-CM

## 2018-11-24 NOTE — Progress Notes (Signed)
Chief Complaint:   Chief Complaint   Patient presents with   ??? Post-Op Check     FU Left little finger trigger release.  Has numbness outer side of finger.       HPI   5 weeks after left small trigger finger release.  Good relief of his pain and full range of motion is returned without triggering.  He still has some partial numbness on the ulnar border of the small finger although intact sensation radial aspect.    Patient Active Problem List   Diagnosis   ??? Acute pain of right shoulder   ??? Trigger finger, left little finger       Past Medical History:   Diagnosis Date   ??? Trigger finger, left little finger 10/2018       Past Surgical History:   Procedure Laterality Date   ??? BACK SURGERY  1965   ??? FINGER TRIGGER RELEASE Right 03/04/2010    Right middle trigger finger  R.Adyn Hoes, MD   ??? FINGER TRIGGER RELEASE Left 03/05/2016    Left trigger thumb release R. Manolito Jurewicz, MD   ??? FINGER TRIGGER RELEASE Left 10/18/2018    LEFT SMALL FINGER TRIGGER FINGER RELEASE / TENOSYNOVECTOMY performed by Concha Norwayaymond J Zurri Rudden[facility]   ??? KNEE ARTHROSCOPY Left 02/18/2012    Left knee arthroscopy R. Alpha Mysliwiec, MD   ??? TONSILLECTOMY         No current outpatient medications on file.     No current facility-administered medications for this visit.        No Known Allergies    Social History     Socioeconomic History   ??? Marital status: Married     Spouse name: None   ??? Number of children: None   ??? Years of education: None   ??? Highest education level: None   Occupational History   ??? None   Social Needs   ??? Financial resource strain: None   ??? Food insecurity     Worry: None     Inability: None   ??? Transportation needs     Medical: None     Non-medical: None   Tobacco Use   ??? Smoking status: Former Smoker     Types: Cigarettes     Last attempt to quit: 1994     Years since quitting: 26.5   ??? Smokeless tobacco: Never Used   Substance and Sexual Activity   ??? Alcohol use: Yes     Alcohol/week: 14.0 standard drinks     Types: 14 Cans of beer  per week   ??? Drug use: No   ??? Sexual activity: None   Lifestyle   ??? Physical activity     Days per week: None     Minutes per session: None   ??? Stress: None   Relationships   ??? Social Wellsite geologistconnections     Talks on phone: None     Gets together: None     Attends religious service: None     Active member of club or organization: None     Attends meetings of clubs or organizations: None     Relationship status: None   ??? Intimate partner violence     Fear of current or ex partner: None     Emotionally abused: None     Physically abused: None     Forced sexual activity: None   Other Topics Concern   ??? None   Social History Narrative   ???  None       Family History   Problem Relation Age of Onset   ??? Heart Disease Brother 43        pacemaker         Review of Systems   No fever, chills, or other constitutionalsymptoms.  No numbness or other neuro symptoms.    @PHYSEXAMORTHO @   Palmar incision left hand well-healed without tenderness.  Full range of motion without locking.  Sharp sensation demonstrates diminished but present sensation throughout the distribution of the ulnar digital nerve of the small finger.    Physical Exam    Patient is alert and oriented.  Well-developed well-nourished.  Pupils equal and reactive. Scleraeanicteric.  Neck supple  Lungs clear.  Cardiac rate and rhythm regular.  Abdomen soft and nontender.  Skin warm and dry.                        ASSESSMENT/PLAN:    Antonio Aguilar was seen today for post-op check.    Diagnoses and all orders for this visit:    Trigger little finger of left hand    S/P trigger finger release    Good resolution the trigger finger but he has a partial digital nerve deficit.  Since sensation is present prognosis good for recovery over several months.  He is comfortable with this and will follow-up with me as needed.    Return if symptoms worsen or fail to improve.       Margarette Asal, MD    11/24/2018  2:43 PM

## 2019-04-07 ENCOUNTER — Ambulatory Visit: Admit: 2019-04-07 | Discharge: 2019-04-07 | Payer: MEDICARE | Attending: Orthopaedic Surgery

## 2019-04-07 ENCOUNTER — Ambulatory Visit: Admit: 2019-04-07 | Discharge: 2019-04-12 | Payer: MEDICARE

## 2019-04-07 DIAGNOSIS — M1711 Unilateral primary osteoarthritis, right knee: Secondary | ICD-10-CM

## 2019-04-07 MED ORDER — TRIAMCINOLONE ACETONIDE 40 MG/ML IJ SUSP
40 MG/ML | Freq: Once | INTRAMUSCULAR | Status: AC
Start: 2019-04-07 — End: 2019-04-07
  Administered 2019-04-07: 14:00:00 80 mg via INTRA_ARTICULAR

## 2019-04-07 MED ORDER — BUPIVACAINE HCL 0.25 % IJ SOLN
0.25 % | Freq: Once | INTRAMUSCULAR | Status: AC
Start: 2019-04-07 — End: 2019-04-07
  Administered 2019-04-07: 14:00:00 7.5 mL via INTRA_ARTICULAR

## 2019-04-07 NOTE — Progress Notes (Signed)
Chief Complaint:   Chief Complaint   Patient presents with   ??? Knee Pain     right knee pain / flair up 6 to 7 weeks ago       HPI   78 year old man here for evaluation of chronic recently increased right knee pain.  No recent injury history no surgical history with respect to the knee.  Discomfort and tightness indicated posterior medially.  Takes no medications for pain.    Patient Active Problem List   Diagnosis   ??? Acute pain of right shoulder   ??? Trigger finger, left little finger       Past Medical History:   Diagnosis Date   ??? Trigger finger, left little finger 10/2018       Past Surgical History:   Procedure Laterality Date   ??? BACK SURGERY  1965   ??? FINGER TRIGGER RELEASE Right 03/04/2010    Right middle trigger finger  R.Dalonte Hardage, MD   ??? FINGER TRIGGER RELEASE Left 03/05/2016    Left trigger thumb release R. Tao Satz, MD   ??? FINGER TRIGGER RELEASE Left 10/18/2018    LEFT SMALL FINGER TRIGGER FINGER RELEASE / TENOSYNOVECTOMY performed by Concha Norway, MD at [facility]   ??? KNEE ARTHROSCOPY Left 02/18/2012    Left knee arthroscopy R. Linnie Mcglocklin, MD   ??? TONSILLECTOMY         No current outpatient medications on file.     Current Facility-Administered Medications   Medication Dose Route Frequency Provider Last Rate Last Dose   ??? triamcinolone acetonide (KENALOG-40) injection 80 mg  80 mg Intra-articular Once Concha Norway, MD       ??? bupivacaine (MARCAINE) 0.25 % injection 7.5 mg  3 mL Intra-articular Once Concha Norway, MD           No Known Allergies    Social History     Socioeconomic History   ??? Marital status: Married     Spouse name: None   ??? Number of children: None   ??? Years of education: None   ??? Highest education level: None   Occupational History   ??? None   Social Needs   ??? Financial resource strain: None   ??? Food insecurity     Worry: None     Inability: None   ??? Transportation needs     Medical: None     Non-medical: None   Tobacco Use   ??? Smoking status: Former Smoker     Types:  Cigarettes     Last attempt to quit: 1994     Years since quitting: 26.9   ??? Smokeless tobacco: Never Used   Substance and Sexual Activity   ??? Alcohol use: Yes     Alcohol/week: 14.0 standard drinks     Types: 14 Cans of beer per week     Frequency: 4 or more times a week     Drinks per session: 1 or 2     Binge frequency: Daily or almost daily   ??? Drug use: No   ??? Sexual activity: None   Lifestyle   ??? Physical activity     Days per week: None     Minutes per session: None   ??? Stress: None   Relationships   ??? Social Wellsite geologist on phone: None     Gets together: None     Attends religious service: None     Active member of club or  organization: None     Attends meetings of clubs or organizations: None     Relationship status: None   ??? Intimate partner violence     Fear of current or ex partner: None     Emotionally abused: None     Physically abused: None     Forced sexual activity: None   Other Topics Concern   ??? None   Social History Narrative   ??? None       Family History   Problem Relation Age of Onset   ??? Heart Disease Brother 73        pacemaker         Review of Systems   No fever, chills, or other constitutionalsymptoms.  No numbness or other neuro symptoms.  No chest pain.  No dyspnea.    @PHYSEXAMORTHO @   Right knee varus.  No effusion or erythema.  Range of motion 10 to 125 degrees.  Discomfort to palpation posterior medial soft tissues.  No pain with right hip rotation.    Physical Exam    Patient is alert and oriented.  Well-developed well-nourished.  BMI 28  Pupils equal and reactive. Scleraeanicteric.  Neck supple  Lungs clear.  Cardiac rate and rhythm regular.  Abdomen soft and nontender.  Skin warm and dry.       XRAY:   Knee x-rays today bilateral standing AP flexion weightbearing lateral view of the right knee.  The right knee demonstrate stage IV medial compartment joint space narrowing on the flexion weightbearing view.  Also advanced patellofemoral degenerative changes.  Left knee is  less involved.  Impression: Significant osteoarthritis right knee.                    ASSESSMENT/PLAN:    Antonio Aguilar was seen today for knee pain.    Diagnoses and all orders for this visit:    Primary osteoarthritis of right knee  -     XR KNEE BILATERAL STANDING; Future  -     XR KNEE RIGHT (1-2 VIEWS); Future  -     PR ARTHROCENTESIS ASPIR&/INJ MAJOR JT/BURSA W/O [place]  -     Amb External Referral To Physical Therapy    Right knee pain, unspecified chronicity    Other orders  -     triamcinolone acetonide (KENALOG-40) injection 80 mg  -     bupivacaine (MARCAINE) 0.25 % injection 7.5 mg    Findings and images reviewed with the patient.  Reviewed use of OTC analgesics.  Prescription to see PT to instruct in appropriate knee exercises.  Patient requests steroid injection today since he is going to [place] later this month for 4 to 5 months.  After review of indications, risks and limitations, after alcohol prep, right knee injection with triamcinolone 80mg  and 3 cc 0.25% marcaine given today.  Pt tolerated without complication.      Return if symptoms worsen or fail to improve.       Margarette Asal, MD    04/07/2019  8:23 AM

## 2021-01-01 NOTE — Telephone Encounter (Signed)
Pt on 10 yr recall for colonoscopy and he now lives in [place] and has a Dr. there.

## 2023-08-10 IMAGING — MR MRI BRAIN W/WO CONTRAST
2 of 17 series · 5 of 48 positions shown · IV contrast (Gadolinium)
Comparison: None

________________________________________________________________________________________________ 
MRI BRAIN W/WO CONTRAST, 08/10/2023 [DATE]: 
CLINICAL INDICATION: Weakness and lethargy one week ago. Lung cancer.
TECHNIQUE: Multiplanar, multiecho position MR images of the brain were performed 
without and with 8.5 mL of Gadavist were injected intravenously by hand. 1.5 mL 
of Gadavist discarded. Patient was scanned on a 1.5T magnet.

[Series 1002: T1 post-contrast · coronal · 1.0mm · 0.24mm/px · 3 of 180 slices shown (1 of 2)]
[im 26/180]
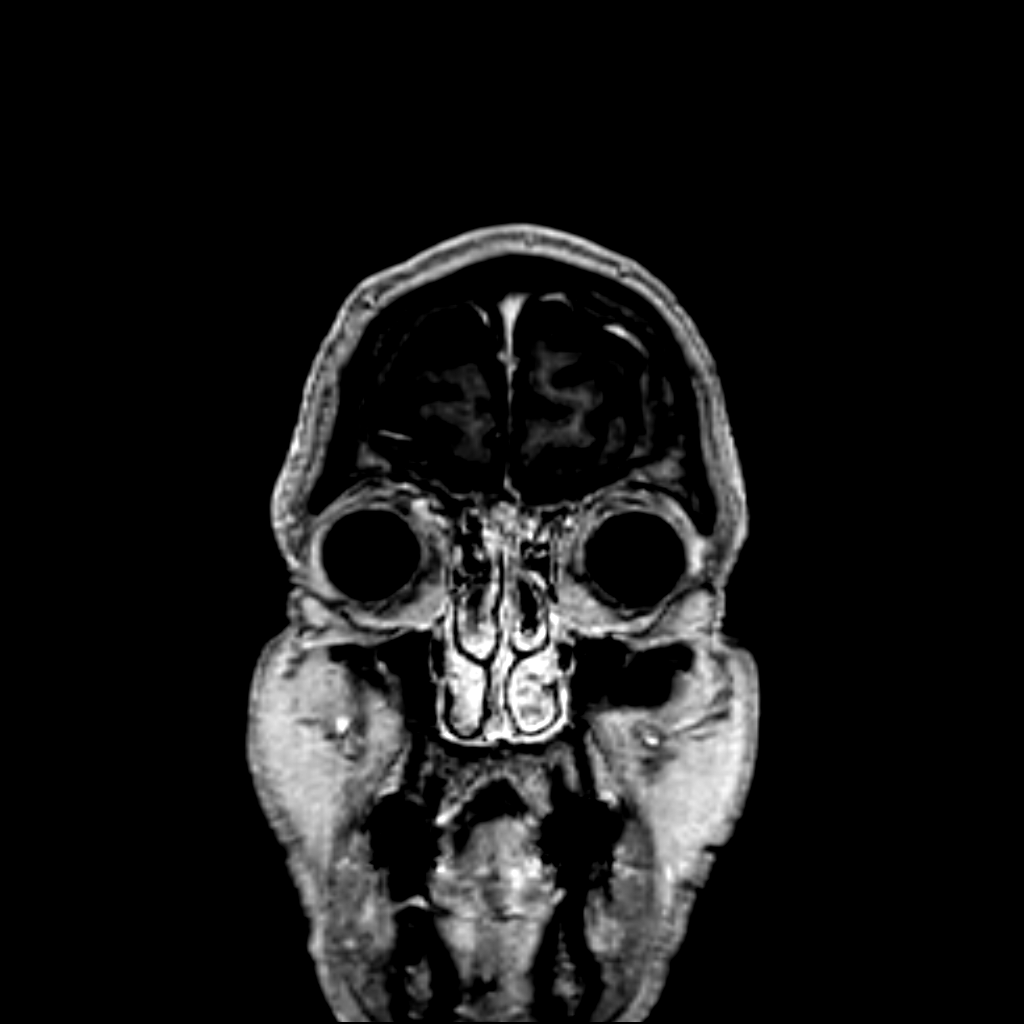
[im 103/180]
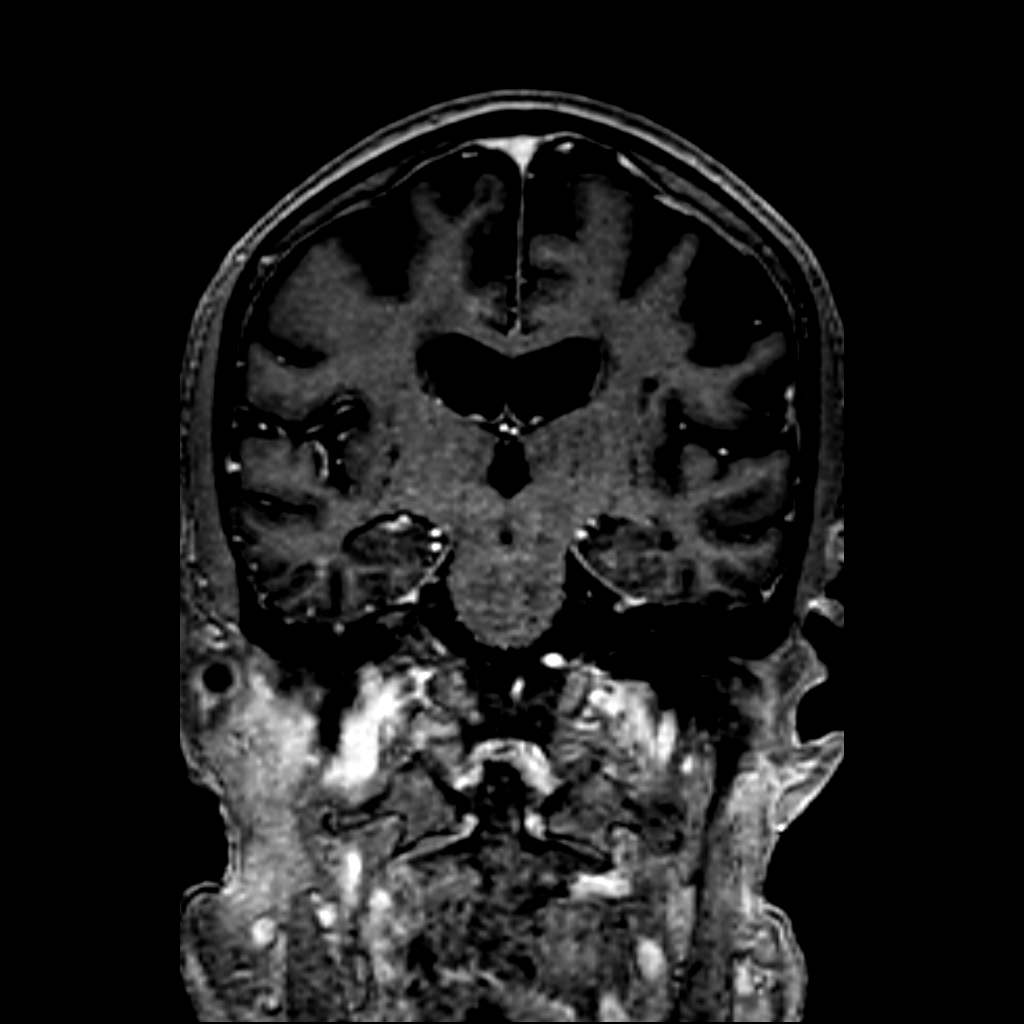
[im 154/180]
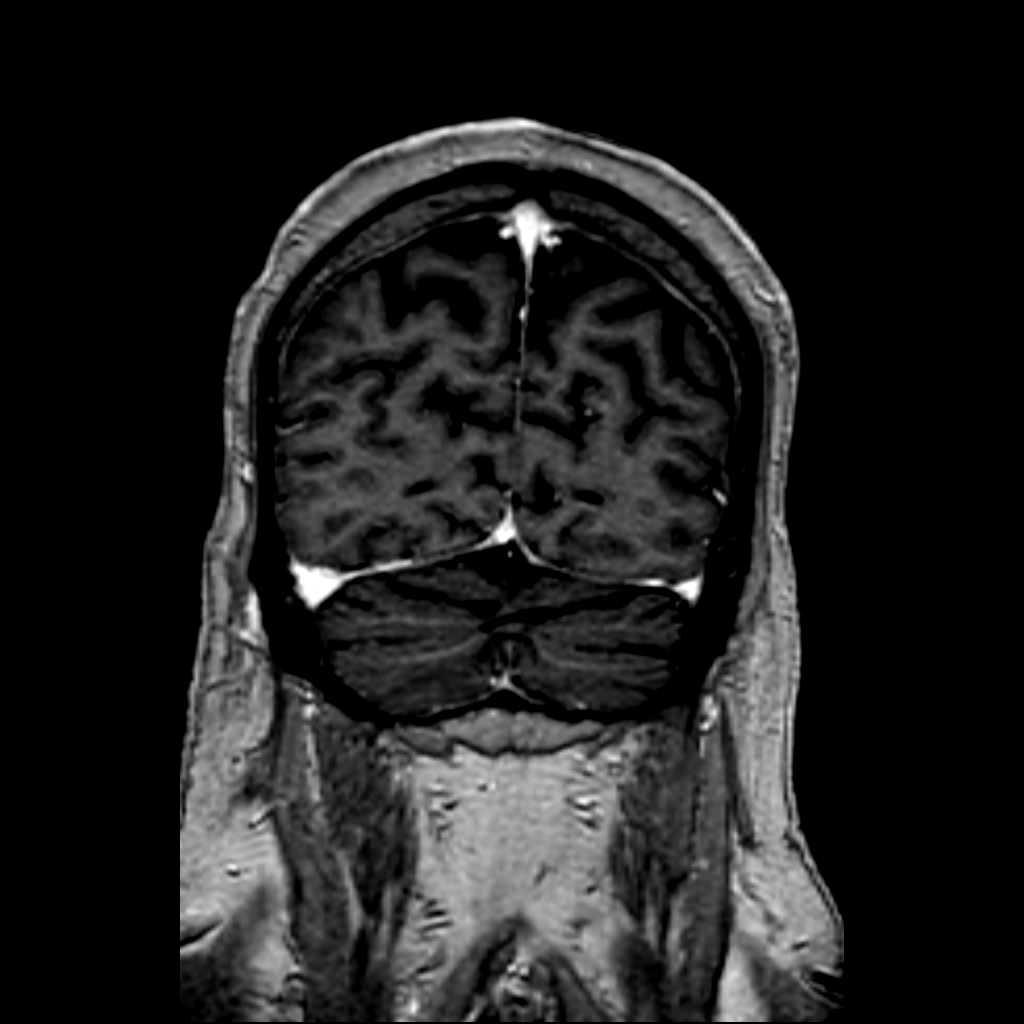

[Series 1003: T1 post-contrast · axial · 1.0mm · 0.24mm/px · z∈[-48,+8]mm · 2 of 170 slices shown (2 of 2)]
[im 29/170]
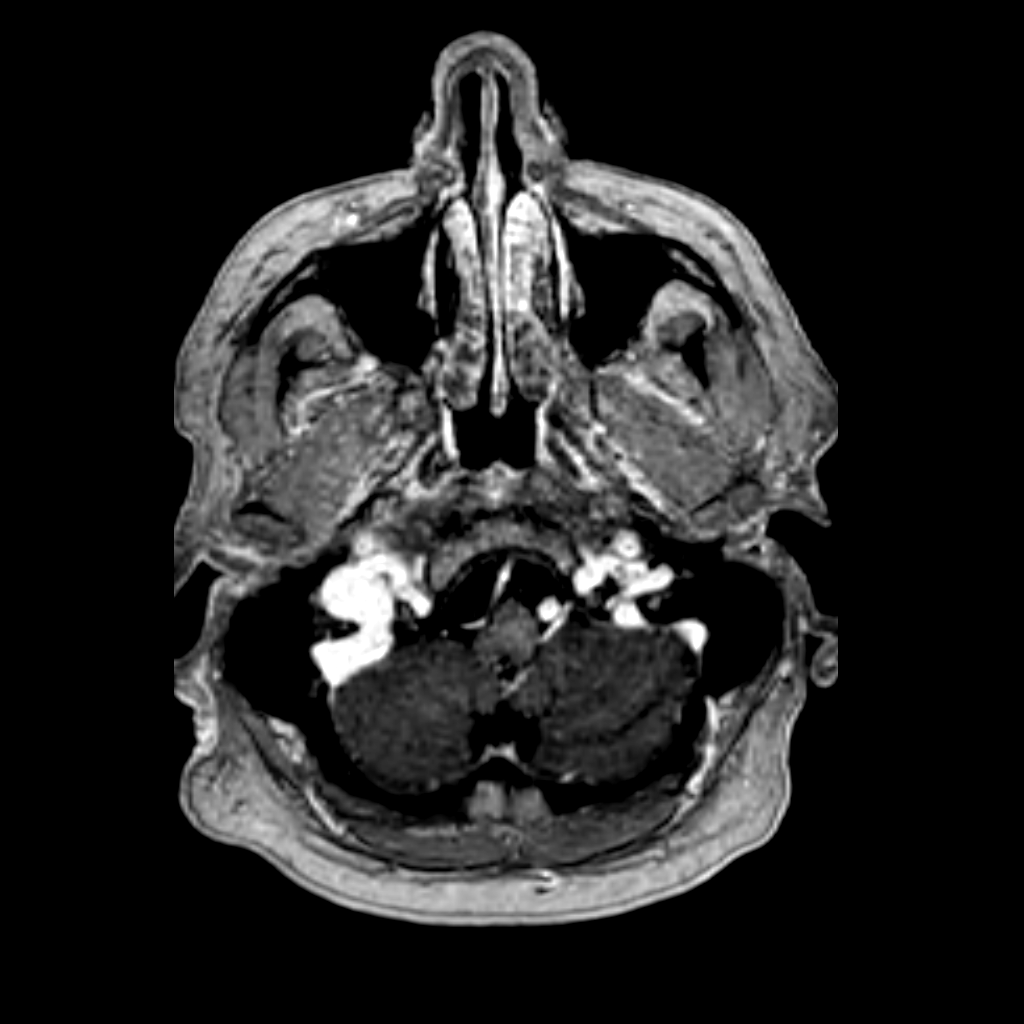
[im 85/170]
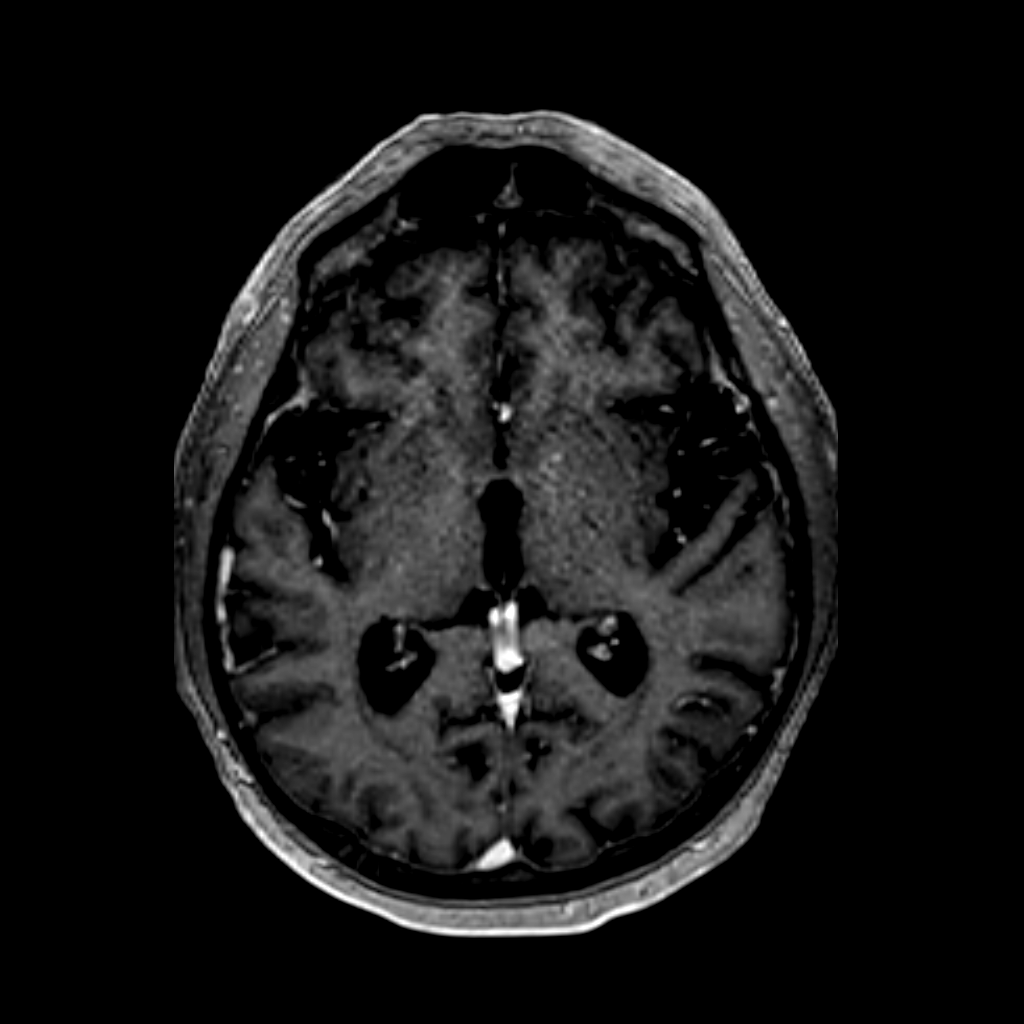

[5 of 48 positions shown; findings below may reference images not displayed]

FINDINGS: -------------------------------------------------------------------------------- 
------------------------- 
INTRACRANIAL: 
Extensive nonspecific confluent periventricular and deep white matter T2 FLAIR 
hyperintensity is most likely chronic microangiopathy. Prominent perivascular 
space left corona radiata. Additionally, there is a nonenhancing 3 mm cystic 
focus within the cortex of the left occipital lobe. However, no mass, mass 
effect, or abnormal extra-axial fluid collection. 
No acute ischemia. There are numerous foci of susceptibility artifact within the 
left occipital lobe consistent with previous hemosiderin staining. There are 
also several foci within the left cerebellar hemisphere. There is another focus 
at the right frontal vertex. Patency of the intracranial vascular flow voids.  
No acute intracranial hemorrhage, mass effect, midline shift. No large sellar 
mass. No hydrocephalus. Cerebral volume is age appropriate.  No pathologic 
contrast enhancement.  
-------------------------------------------------------------------------------- 
----------------------- 
OTHER: 
ORBITS/SINUSES/T-BONES:  Visualized orbits show no acute abnormality or mass.  
Bilateral mastoid effusions, larger on the right.  Mild bilateral maxillary 
sinus membrane thickening. 
MARROW SIGNAL/SOFT TISSUES: No focal suspect signal abnormality. 
-------------------------------------------------------------------------------- 
-------------------
IMPRESSION: No evidence of intracranial metastatic disease. No acute intracranial process. 
Extensive chronic microangiopathy. 
Numerous foci of susceptibility artifact within the left occipital lobe, right 
frontal vertex, and left cerebellar hemisphere consistent with chronic 
hemosiderin staining. 
Bilateral mastoid effusions, larger on the right.

## 2023-10-04 DEATH — deceased
